# Patient Record
Sex: Male | Born: 1960 | Race: White | Hispanic: No | Marital: Single | Smoking: Never smoker
Health system: Southern US, Community
[De-identification: ages and names within clinical notes are randomized; demographics above are authoritative.]

## PROBLEM LIST (undated history)

## (undated) DIAGNOSIS — I10 Essential (primary) hypertension: Secondary | ICD-10-CM

---

## 1999-06-16 ENCOUNTER — Encounter: Payer: Self-pay | Admitting: Internal Medicine

## 1999-06-16 ENCOUNTER — Ambulatory Visit (HOSPITAL_COMMUNITY): Admission: RE | Admit: 1999-06-16 | Discharge: 1999-06-16 | Payer: Self-pay | Admitting: Internal Medicine

## 2002-10-08 ENCOUNTER — Ambulatory Visit (HOSPITAL_COMMUNITY): Admission: RE | Admit: 2002-10-08 | Discharge: 2002-10-08 | Payer: Self-pay | Admitting: Internal Medicine

## 2004-12-29 ENCOUNTER — Ambulatory Visit: Payer: Self-pay | Admitting: Internal Medicine

## 2005-01-02 ENCOUNTER — Ambulatory Visit: Payer: Self-pay | Admitting: Internal Medicine

## 2005-04-17 ENCOUNTER — Ambulatory Visit: Payer: Self-pay | Admitting: Internal Medicine

## 2005-05-08 ENCOUNTER — Ambulatory Visit: Payer: Self-pay | Admitting: Internal Medicine

## 2005-06-02 ENCOUNTER — Emergency Department (HOSPITAL_COMMUNITY): Admission: EM | Admit: 2005-06-02 | Discharge: 2005-06-02 | Payer: Self-pay | Admitting: Emergency Medicine

## 2005-07-07 ENCOUNTER — Ambulatory Visit: Payer: Self-pay | Admitting: Internal Medicine

## 2005-09-15 ENCOUNTER — Ambulatory Visit: Payer: Self-pay | Admitting: Internal Medicine

## 2005-12-12 ENCOUNTER — Ambulatory Visit: Payer: Self-pay | Admitting: Internal Medicine

## 2006-01-23 ENCOUNTER — Ambulatory Visit: Payer: Self-pay | Admitting: Internal Medicine

## 2006-02-06 ENCOUNTER — Ambulatory Visit: Payer: Self-pay | Admitting: Internal Medicine

## 2006-03-01 ENCOUNTER — Ambulatory Visit: Payer: Self-pay | Admitting: Internal Medicine

## 2007-06-26 DIAGNOSIS — I10 Essential (primary) hypertension: Secondary | ICD-10-CM | POA: Insufficient documentation

## 2007-06-26 DIAGNOSIS — E785 Hyperlipidemia, unspecified: Secondary | ICD-10-CM

## 2009-08-18 ENCOUNTER — Emergency Department (HOSPITAL_COMMUNITY): Admission: EM | Admit: 2009-08-18 | Discharge: 2009-08-18 | Payer: Self-pay | Admitting: Emergency Medicine

## 2012-05-02 DIAGNOSIS — L259 Unspecified contact dermatitis, unspecified cause: Secondary | ICD-10-CM | POA: Diagnosis not present

## 2012-05-02 DIAGNOSIS — R7309 Other abnormal glucose: Secondary | ICD-10-CM | POA: Diagnosis not present

## 2012-05-02 DIAGNOSIS — I10 Essential (primary) hypertension: Secondary | ICD-10-CM | POA: Diagnosis not present

## 2012-05-02 DIAGNOSIS — F411 Generalized anxiety disorder: Secondary | ICD-10-CM | POA: Diagnosis not present

## 2012-05-02 DIAGNOSIS — E782 Mixed hyperlipidemia: Secondary | ICD-10-CM | POA: Diagnosis not present

## 2012-06-20 ENCOUNTER — Ambulatory Visit (INDEPENDENT_AMBULATORY_CARE_PROVIDER_SITE_OTHER): Payer: 59 | Admitting: Family Medicine

## 2012-06-20 VITALS — BP 120/78 | HR 67 | Resp 20 | Ht 72.5 in | Wt 258.0 lb

## 2012-06-20 DIAGNOSIS — J069 Acute upper respiratory infection, unspecified: Secondary | ICD-10-CM | POA: Diagnosis not present

## 2012-06-20 MED ORDER — AMOXICILLIN 500 MG PO CAPS: 500.0000 mg | ORAL_CAPSULE | Freq: Two times a day (BID) | ORAL | Status: AC

## 2012-06-20 MED ORDER — GUAIFENESIN-CODEINE 200-10 MG/5ML PO LIQD: 5.0000 mL | Freq: Three times a day (TID) | ORAL | Status: AC | PRN

## 2012-06-20 NOTE — Patient Instructions (Addendum)
Use the cough syrup at night or when you want to sleep.  Do not drive with this medicine. Take the Amoxicillin twice daily for 7 days.    If you start feeling worse, come back and see [place].  It was good to meet you.

## 2012-06-20 NOTE — Progress Notes (Signed)
Patient ID: James Osborne, male   DOB: 1961/05/22, 51 y.o.   MRN: [id number] James Osborne is a 51 y.o. male who presents to Urgent Care today for URI symptoms:  1.  URI symptoms:  Present for past 10 days.  Describes rhinorrhea, sinus congestion, moderate cough.  Has tried Delsum without relief.  Sick contacts are none.  No fevers or chills. No nausea or vomiting.  Has had some headaches due to congestion and mild ringing in Right ear which has now resolved.      PMH reviewed.  ROS as above otherwise neg.  No chest pain, palpitations, SOB, Fever, Chills, Abd pain, N/V/D.  Medications reviewed. Current Outpatient Prescriptions  Medication Sig Dispense Refill  . amLODipine (NORVASC) 5 MG tablet Take 5 mg by mouth daily.      Marland Kitchen atorvastatin (LIPITOR) 10 MG tablet Take 10 mg by mouth daily.      . benazepril (LOTENSIN) 10 MG tablet Take 10 mg by mouth daily.      . citalopram (CELEXA) 20 MG tablet Take 20 mg by mouth daily.      Marland Kitchen amoxicillin (AMOXIL) 500 MG capsule Take 1 capsule (500 mg total) by mouth 2 (two) times daily. X 7 days  30 capsule  0  . Guaifenesin-Codeine 200-10 MG/5ML LIQD Take 5 mLs by mouth 3 (three) times daily as needed.  1 Bottle  0    Exam:  BP 120/78  Pulse 67  Resp 20  Ht 6' 0.5" (1.842 m)  Wt 258 lb (117.028 kg)  BMI 34.51 kg/m2  SpO2 96% Gen:  Patient sitting on exam table, appears stated age in no acute distress Head: Normocephalic atraumatic Eyes: EOMI, PERRL, sclera and conjunctiva non-erythematous Nose:  Nasal turbinates grossly enlarged bilaterally. Some exudates noted. Tender to palpation of maxillary sinus  Ear:  TM's and canals clear and non-erythematous BL.  No pain on palpation of pinna BL.  Hearing intact grossly BL. Mouth: Mucosa membranes moist. Tonsils +2, nonenlarged, non-erythematous. Neck: No cervical lymphadenopathy noted Heart:  RRR, no murmurs auscultated. Pulm:  Clear to auscultation bilaterally with good air movement.  No wheezes or  rales noted.      Assessment and Plan:  1.  Plan to treat due to length of symptoms and no improvement.   Will prescribe Amoxicillin x 7 days.  Guaf-codeine for cough relief.  Instructed patient to return in 1 week for checkup or sooner if worsening or no improvement.

## 2012-11-28 DIAGNOSIS — Z23 Encounter for immunization: Secondary | ICD-10-CM | POA: Diagnosis not present

## 2013-01-27 ENCOUNTER — Ambulatory Visit (INDEPENDENT_AMBULATORY_CARE_PROVIDER_SITE_OTHER): Payer: 59 | Admitting: Emergency Medicine

## 2013-01-27 VITALS — BP 172/98 | HR 67 | Temp 97.6°F | Resp 16 | Ht 72.75 in | Wt 254.0 lb

## 2013-01-27 DIAGNOSIS — R110 Nausea: Secondary | ICD-10-CM

## 2013-01-27 DIAGNOSIS — A088 Other specified intestinal infections: Secondary | ICD-10-CM

## 2013-01-27 MED ORDER — ONDANSETRON 8 MG PO TBDP: 8.0000 mg | ORAL_TABLET | Freq: Three times a day (TID) | ORAL | Status: AC | PRN

## 2013-01-27 MED ORDER — LOPERAMIDE HCL 2 MG PO TABS: ORAL_TABLET | ORAL | Status: AC

## 2013-01-27 NOTE — Patient Instructions (Signed)
Viral Gastroenteritis Viral gastroenteritis is also known as stomach flu. This condition affects the stomach and intestinal tract. It can cause sudden diarrhea and vomiting. The illness typically lasts 3 to 8 days. Most people develop an immune response that eventually gets rid of the virus. While this natural response develops, the virus can make you quite ill. CAUSES  Many different viruses can cause gastroenteritis, such as rotavirus or noroviruses. You can catch one of these viruses by consuming contaminated food or water. You may also catch a virus by sharing utensils or other personal items with an infected person or by touching a contaminated surface. SYMPTOMS  The most common symptoms are diarrhea and vomiting. These problems can cause a severe loss of body fluids (dehydration) and a body salt (electrolyte) imbalance. Other symptoms may include:  Fever.  Headache.  Fatigue.  Abdominal pain. DIAGNOSIS  Your caregiver can usually diagnose viral gastroenteritis based on your symptoms and a physical exam. A stool sample may also be taken to test for the presence of viruses or other infections. TREATMENT  This illness typically goes away on its own. Treatments are aimed at rehydration. The most serious cases of viral gastroenteritis involve vomiting so severely that you are not able to keep fluids down. In these cases, fluids must be given through an intravenous line (IV). HOME CARE INSTRUCTIONS   Drink enough fluids to keep your urine clear or pale yellow. Drink small amounts of fluids frequently and increase the amounts as tolerated.  Ask your caregiver for specific rehydration instructions.  Avoid:  Foods high in sugar.  Alcohol.  Carbonated drinks.  Tobacco.  Juice.  Caffeine drinks.  Extremely hot or cold fluids.  Fatty, greasy foods.  Too much intake of anything at one time.  Dairy products until 24 to 48 hours after diarrhea stops.  You may consume probiotics.  Probiotics are active cultures of beneficial bacteria. They may lessen the amount and number of diarrheal stools in adults. Probiotics can be found in yogurt with active cultures and in supplements.  Wash your hands well to avoid spreading the virus.  Only take over-the-counter or prescription medicines for pain, discomfort, or fever as directed by your caregiver. Do not give aspirin to children. Antidiarrheal medicines are not recommended.  Ask your caregiver if you should continue to take your regular prescribed and over-the-counter medicines.  Keep all follow-up appointments as directed by your caregiver. SEEK IMMEDIATE MEDICAL CARE IF:   You are unable to keep fluids down.  You do not urinate at least once every 6 to 8 hours.  You develop shortness of breath.  You notice blood in your stool or vomit. This may look like coffee grounds.  You have abdominal pain that increases or is concentrated in one small area (localized).  You have persistent vomiting or diarrhea.  You have a fever.  The patient is a child younger than 3 months, and he or she has a fever.  The patient is a child older than 3 months, and he or she has a fever and persistent symptoms.  The patient is a child older than 3 months, and he or she has a fever and symptoms suddenly get worse.  The patient is a baby, and he or she has no tears when crying. MAKE SURE YOU:   Understand these instructions.  Will watch your condition.  Will get help right away if you are not doing well or get worse. Document Released: 11/13/2005 Document Revised: 02/05/2012 Document Reviewed: 08/30/2011   ExitCare Patient Information 2013 [place], [place]. Clear Liquid Diet The clear liquid dietconsists of foods that are liquid or will become liquid at room temperature.You should be able to see through the liquid and beverages. Examples of foods allowed on a clear liquid diet include fruit juice, broth or bouillon, gelatin, or frozen  ice pops. The purpose of this diet is to provide necessary fluid, electrolytes such as sodium and potassium, and energy to keep the body functioning during times when you are not able to consume a regular diet.A clear liquid diet should not be continued for long periods of time as it is not nutritionally adequate.  REASONS FOR USING A CLEAR LIQUID DIET  In sudden onset (acute) conditions for a patient before or after surgery.  As the first step in oral feeding.  For fluid and electrolyte replacement in diarrheal diseases.  As a diet before certain medical tests are performed. ADEQUACY The clear liquid diet is adequate only in ascorbic acid, according to the Recommended Dietary Allowances of the [employer]. CHOOSING FOODS Breads and Starches  Allowed:  None are allowed.  Avoid: All are avoided. Vegetables  Allowed:  Strained tomato or vegetable juice.  Avoid: Any others. Fruit  Allowed:  Strained fruit juices and fruit drinks. Include 1 serving of citrus or vitamin C-enriched fruit juice daily.  Avoid: Any others. Meat and Meat Substitutes  Allowed:  None are allowed.  Avoid: All are avoided. Milk  Allowed:  None are allowed.  Avoid: All are avoided. Soups and Combination Foods  Allowed:  Clear bouillon, broth, or strained broth-based soups.  Avoid: Any others. Desserts and Sweets  Allowed:  Sugar, honey. High protein gelatin. Flavored gelatin, ices, or frozen ice pops that do not contain milk.  Avoid: Any others. Fats and Oils  Allowed:  None are allowed.  Avoid: All are avoided. Beverages  Allowed: Cereal beverages, coffee (regular or decaffeinated), tea, or soda at the discretion of your caregiver.  Avoid: Any others. Condiments  Allowed:  Iodized salt.  Avoid: Any others, including pepper. Supplements  Allowed:  Liquid nutrition beverages.  Avoid: Any others that contain lactose or fiber. SAMPLE MEAL PLAN Breakfast  4 oz (120  mL) strained orange juice.   to 1 cup (125 to 250 mL) gelatin (plain or fortified).  1 cup (250 mL) beverage (coffee or tea).  Sugar, if desired. Midmorning Snack   cup (125 mL) gelatin (plain or fortified). Lunch  1 cup (250 mL) broth or consomm.  4 oz (120 mL) strained grapefruit juice.   cup (125 mL) gelatin (plain or fortified).  1 cup (250 mL) beverage (coffee or tea).  Sugar, if desired. Midafternoon Snack   cup (125 mL) fruit ice.   cup (125 mL) strained fruit juice. Dinner  1 cup (250 mL) broth or consomm.   cup (125 mL) cranberry juice.   cup (125 mL) flavored gelatin (plain or fortified).  1 cup (250 mL) beverage (coffee or tea).  Sugar, if desired. Evening Snack  4 oz (120 mL) strained apple juice (vitamin C-fortified).   cup (125 mL) flavored gelatin (plain or fortified). Document Released: 11/13/2005 Document Revised: 02/05/2012 Document Reviewed: 02/10/2011 [facility] Patient Information 2013 [place], [place].

## 2013-01-27 NOTE — Progress Notes (Signed)
[facility] [address], [place] [address] [phone]  Date:  01/27/2013   Name:  James Osborne   DOB:  [birth year]   MRN:  [id number]  PCP:  No primary provider on file.    Chief Complaint: Nausea, Abdominal Pain and Emesis   History of Present Illness:  James Osborne is a 52 y.o. very pleasant male patient who presents with the following:  Ill past 48 hours with nausea but no vomiting. Has myalgias and arthralgias and fatigue.  Abdominal cramping.  No diarrhea.  Has fever and chills.  Works in Personnel officer at [employer].  No sick contacts.  Denies other complaint or health concern today. No improvement with over the counter medications or other home remedies.   Patient Active Problem List  Diagnosis  . HYPERLIPIDEMIA  . HYPERTENSION    No past medical history on file.  No past surgical history on file.  History  Substance Use Topics  . Smoking status: Never Smoker   . Smokeless tobacco: Not on file  . Alcohol Use: Not on file    No family history on file.  No Known Allergies  Medication list has been reviewed and updated.  Current Outpatient Prescriptions on File Prior to Visit  Medication Sig Dispense Refill  . amLODipine (NORVASC) 5 MG tablet Take 5 mg by mouth daily.      Marland Kitchen atorvastatin (LIPITOR) 10 MG tablet Take 10 mg by mouth daily.      . benazepril (LOTENSIN) 10 MG tablet Take 10 mg by mouth daily.      . citalopram (CELEXA) 20 MG tablet Take 20 mg by mouth daily.      . Guaifenesin-Codeine 200-10 MG/5ML LIQD Take 5 mLs by mouth 3 (three) times daily as needed.  1 Bottle  0   No current facility-administered medications on file prior to visit.    Review of Systems:  As per HPI, otherwise negative.    Physical Examination: Filed Vitals:   01/27/13 1007  BP: 172/98  Pulse: 67  Temp: 97.6 F (36.4 C)  Resp: 16   Filed Vitals:   01/27/13 1007  Height: 6' 0.75" (1.848 m)  Weight: 254 lb (115.214 kg)   Body mass  index is 33.74 kg/(m^2). Ideal Body Weight: Weight in (lb) to have BMI = 25: 187.8  GEN: WDWN, NAD, Non-toxic, A & O x 3 HEENT: Atraumatic, Normocephalic. Neck supple. No masses, No LAD. Ears and Nose: No external deformity. CV: RRR, No M/G/R. No JVD. No thrill. No extra heart sounds. PULM: CTA B, no wheezes, crackles, rhonchi. No retractions. No resp. distress. No accessory muscle use. ABD: S, NT, ND, +BS. No rebound. No HSM. EXTR: No c/c/e NEURO Normal gait.  PSYCH: Normally interactive. Conversant. Not depressed or anxious appearing.  Calm demeanor.    Assessment and Plan: Gastroenteritis May not return to work until completely resolved. Imodium zofran   Carmelina Dane, MD

## 2013-01-29 ENCOUNTER — Telehealth: Payer: Self-pay

## 2013-01-29 DIAGNOSIS — R109 Unspecified abdominal pain: Secondary | ICD-10-CM | POA: Diagnosis not present

## 2013-01-29 DIAGNOSIS — Z713 Dietary counseling and surveillance: Secondary | ICD-10-CM | POA: Diagnosis not present

## 2013-01-29 DIAGNOSIS — N390 Urinary tract infection, site not specified: Secondary | ICD-10-CM | POA: Diagnosis not present

## 2013-01-29 NOTE — Telephone Encounter (Signed)
I had completed a prior auth for pt's Zofran over the phone and received approval, and faxed approval to pharmacy.   Called pt to advise him that he can get this now for any nausea he is having. Pt stated that he isn't having any nausea now. He states that he is having a lot of abdominal pain. He has taken the immodium and no longer has diarrhea, states he is constipated now. He states that he is having a lot of pain when he urinates and is having to urinate frequently. Pt reports that he is getting ready to see his PCP, and I advised him that he may have a UTI and the abdominal cramping could be bladder spasms. and should let his PCP know about the urinary problems. Pt agreed and will just have his PCP write any add'l Rxs that are needed.

## 2013-01-29 NOTE — Telephone Encounter (Signed)
Patient's mother is concerned about son's severe stomach pain. She is wondering if we can call anything in for him.  Best [phone]

## 2013-03-05 DIAGNOSIS — L408 Other psoriasis: Secondary | ICD-10-CM | POA: Diagnosis not present

## 2013-03-05 DIAGNOSIS — K921 Melena: Secondary | ICD-10-CM | POA: Diagnosis not present

## 2013-03-05 DIAGNOSIS — K649 Unspecified hemorrhoids: Secondary | ICD-10-CM | POA: Diagnosis not present

## 2013-03-05 DIAGNOSIS — J309 Allergic rhinitis, unspecified: Secondary | ICD-10-CM | POA: Diagnosis not present

## 2013-07-09 DIAGNOSIS — Z1211 Encounter for screening for malignant neoplasm of colon: Secondary | ICD-10-CM | POA: Diagnosis not present

## 2013-07-09 DIAGNOSIS — D126 Benign neoplasm of colon, unspecified: Secondary | ICD-10-CM | POA: Diagnosis not present

## 2013-07-09 DIAGNOSIS — K648 Other hemorrhoids: Secondary | ICD-10-CM | POA: Diagnosis not present

## 2013-07-09 DIAGNOSIS — K5730 Diverticulosis of large intestine without perforation or abscess without bleeding: Secondary | ICD-10-CM | POA: Diagnosis not present

## 2013-07-09 DIAGNOSIS — K620 Anal polyp: Secondary | ICD-10-CM | POA: Diagnosis not present

## 2013-07-09 DIAGNOSIS — K621 Rectal polyp: Secondary | ICD-10-CM | POA: Diagnosis not present

## 2013-07-22 DIAGNOSIS — M79609 Pain in unspecified limb: Secondary | ICD-10-CM | POA: Diagnosis not present

## 2013-11-11 DIAGNOSIS — R7309 Other abnormal glucose: Secondary | ICD-10-CM | POA: Diagnosis not present

## 2013-11-11 DIAGNOSIS — I10 Essential (primary) hypertension: Secondary | ICD-10-CM | POA: Diagnosis not present

## 2013-11-11 DIAGNOSIS — E782 Mixed hyperlipidemia: Secondary | ICD-10-CM | POA: Diagnosis not present

## 2014-05-15 DIAGNOSIS — I10 Essential (primary) hypertension: Secondary | ICD-10-CM | POA: Diagnosis not present

## 2014-05-15 DIAGNOSIS — E782 Mixed hyperlipidemia: Secondary | ICD-10-CM | POA: Diagnosis not present

## 2014-05-15 DIAGNOSIS — Z23 Encounter for immunization: Secondary | ICD-10-CM | POA: Diagnosis not present

## 2014-05-15 DIAGNOSIS — R7309 Other abnormal glucose: Secondary | ICD-10-CM | POA: Diagnosis not present

## 2014-05-15 DIAGNOSIS — Z0000 Encounter for general adult medical examination without abnormal findings: Secondary | ICD-10-CM | POA: Diagnosis not present

## 2014-05-15 DIAGNOSIS — F411 Generalized anxiety disorder: Secondary | ICD-10-CM | POA: Diagnosis not present

## 2014-05-15 DIAGNOSIS — J309 Allergic rhinitis, unspecified: Secondary | ICD-10-CM | POA: Diagnosis not present

## 2014-05-22 DIAGNOSIS — L03211 Cellulitis of face: Secondary | ICD-10-CM | POA: Diagnosis not present

## 2014-05-22 DIAGNOSIS — L0201 Cutaneous abscess of face: Secondary | ICD-10-CM | POA: Diagnosis not present

## 2014-11-13 DIAGNOSIS — E782 Mixed hyperlipidemia: Secondary | ICD-10-CM | POA: Diagnosis not present

## 2014-11-13 DIAGNOSIS — I10 Essential (primary) hypertension: Secondary | ICD-10-CM | POA: Diagnosis not present

## 2014-11-13 DIAGNOSIS — J309 Allergic rhinitis, unspecified: Secondary | ICD-10-CM | POA: Diagnosis not present

## 2014-11-13 DIAGNOSIS — Z23 Encounter for immunization: Secondary | ICD-10-CM | POA: Diagnosis not present

## 2014-11-13 DIAGNOSIS — R7309 Other abnormal glucose: Secondary | ICD-10-CM | POA: Diagnosis not present

## 2014-11-13 DIAGNOSIS — L309 Dermatitis, unspecified: Secondary | ICD-10-CM | POA: Diagnosis not present

## 2014-11-13 DIAGNOSIS — F419 Anxiety disorder, unspecified: Secondary | ICD-10-CM | POA: Diagnosis not present

## 2015-06-07 DIAGNOSIS — R7309 Other abnormal glucose: Secondary | ICD-10-CM | POA: Diagnosis not present

## 2015-06-07 DIAGNOSIS — Z1159 Encounter for screening for other viral diseases: Secondary | ICD-10-CM | POA: Diagnosis not present

## 2015-06-07 DIAGNOSIS — F419 Anxiety disorder, unspecified: Secondary | ICD-10-CM | POA: Diagnosis not present

## 2015-06-07 DIAGNOSIS — E782 Mixed hyperlipidemia: Secondary | ICD-10-CM | POA: Diagnosis not present

## 2015-06-07 DIAGNOSIS — Z1211 Encounter for screening for malignant neoplasm of colon: Secondary | ICD-10-CM | POA: Diagnosis not present

## 2015-06-07 DIAGNOSIS — I10 Essential (primary) hypertension: Secondary | ICD-10-CM | POA: Diagnosis not present

## 2015-06-07 DIAGNOSIS — J309 Allergic rhinitis, unspecified: Secondary | ICD-10-CM | POA: Diagnosis not present

## 2015-06-07 DIAGNOSIS — Z0000 Encounter for general adult medical examination without abnormal findings: Secondary | ICD-10-CM | POA: Diagnosis not present

## 2015-06-07 DIAGNOSIS — L309 Dermatitis, unspecified: Secondary | ICD-10-CM | POA: Diagnosis not present

## 2015-06-07 DIAGNOSIS — Z1389 Encounter for screening for other disorder: Secondary | ICD-10-CM | POA: Diagnosis not present

## 2015-06-07 DIAGNOSIS — Z125 Encounter for screening for malignant neoplasm of prostate: Secondary | ICD-10-CM | POA: Diagnosis not present

## 2015-09-08 DIAGNOSIS — Z23 Encounter for immunization: Secondary | ICD-10-CM | POA: Diagnosis not present

## 2015-09-18 DIAGNOSIS — J0191 Acute recurrent sinusitis, unspecified: Secondary | ICD-10-CM | POA: Diagnosis not present

## 2016-01-06 DIAGNOSIS — L309 Dermatitis, unspecified: Secondary | ICD-10-CM | POA: Diagnosis not present

## 2016-01-06 DIAGNOSIS — I10 Essential (primary) hypertension: Secondary | ICD-10-CM | POA: Diagnosis not present

## 2016-01-06 DIAGNOSIS — R7303 Prediabetes: Secondary | ICD-10-CM | POA: Diagnosis not present

## 2016-01-06 DIAGNOSIS — E782 Mixed hyperlipidemia: Secondary | ICD-10-CM | POA: Diagnosis not present

## 2016-01-06 DIAGNOSIS — R7309 Other abnormal glucose: Secondary | ICD-10-CM | POA: Diagnosis not present

## 2016-01-06 DIAGNOSIS — F419 Anxiety disorder, unspecified: Secondary | ICD-10-CM | POA: Diagnosis not present

## 2016-05-15 DIAGNOSIS — M79674 Pain in right toe(s): Secondary | ICD-10-CM | POA: Diagnosis not present

## 2016-05-15 DIAGNOSIS — B353 Tinea pedis: Secondary | ICD-10-CM | POA: Diagnosis not present

## 2016-05-15 DIAGNOSIS — M7989 Other specified soft tissue disorders: Secondary | ICD-10-CM | POA: Diagnosis not present

## 2016-07-17 DIAGNOSIS — E782 Mixed hyperlipidemia: Secondary | ICD-10-CM | POA: Diagnosis not present

## 2016-07-17 DIAGNOSIS — I10 Essential (primary) hypertension: Secondary | ICD-10-CM | POA: Diagnosis not present

## 2016-07-17 DIAGNOSIS — L309 Dermatitis, unspecified: Secondary | ICD-10-CM | POA: Diagnosis not present

## 2016-07-17 DIAGNOSIS — F419 Anxiety disorder, unspecified: Secondary | ICD-10-CM | POA: Diagnosis not present

## 2016-07-17 DIAGNOSIS — R7301 Impaired fasting glucose: Secondary | ICD-10-CM | POA: Diagnosis not present

## 2016-07-26 DIAGNOSIS — Z23 Encounter for immunization: Secondary | ICD-10-CM | POA: Diagnosis not present

## 2017-01-26 DIAGNOSIS — J3089 Other allergic rhinitis: Secondary | ICD-10-CM | POA: Diagnosis not present

## 2017-01-26 DIAGNOSIS — Z0000 Encounter for general adult medical examination without abnormal findings: Secondary | ICD-10-CM | POA: Diagnosis not present

## 2017-01-26 DIAGNOSIS — F419 Anxiety disorder, unspecified: Secondary | ICD-10-CM | POA: Diagnosis not present

## 2017-01-26 DIAGNOSIS — L309 Dermatitis, unspecified: Secondary | ICD-10-CM | POA: Diagnosis not present

## 2017-01-26 DIAGNOSIS — F79 Unspecified intellectual disabilities: Secondary | ICD-10-CM | POA: Diagnosis not present

## 2017-01-26 DIAGNOSIS — R7303 Prediabetes: Secondary | ICD-10-CM | POA: Diagnosis not present

## 2017-01-26 DIAGNOSIS — I10 Essential (primary) hypertension: Secondary | ICD-10-CM | POA: Diagnosis not present

## 2017-01-26 DIAGNOSIS — Z125 Encounter for screening for malignant neoplasm of prostate: Secondary | ICD-10-CM | POA: Diagnosis not present

## 2017-01-26 DIAGNOSIS — Z1389 Encounter for screening for other disorder: Secondary | ICD-10-CM | POA: Diagnosis not present

## 2017-01-26 DIAGNOSIS — J309 Allergic rhinitis, unspecified: Secondary | ICD-10-CM | POA: Diagnosis not present

## 2017-01-26 DIAGNOSIS — E782 Mixed hyperlipidemia: Secondary | ICD-10-CM | POA: Diagnosis not present

## 2017-01-26 DIAGNOSIS — Z1211 Encounter for screening for malignant neoplasm of colon: Secondary | ICD-10-CM | POA: Diagnosis not present

## 2017-03-09 DIAGNOSIS — M79674 Pain in right toe(s): Secondary | ICD-10-CM | POA: Diagnosis not present

## 2017-05-08 DIAGNOSIS — M109 Gout, unspecified: Secondary | ICD-10-CM | POA: Diagnosis not present

## 2017-07-13 DIAGNOSIS — Z23 Encounter for immunization: Secondary | ICD-10-CM | POA: Diagnosis not present

## 2017-07-17 DIAGNOSIS — M109 Gout, unspecified: Secondary | ICD-10-CM | POA: Diagnosis not present

## 2017-07-17 DIAGNOSIS — L309 Dermatitis, unspecified: Secondary | ICD-10-CM | POA: Diagnosis not present

## 2017-08-09 DIAGNOSIS — L309 Dermatitis, unspecified: Secondary | ICD-10-CM | POA: Diagnosis not present

## 2017-08-09 DIAGNOSIS — F79 Unspecified intellectual disabilities: Secondary | ICD-10-CM | POA: Diagnosis not present

## 2017-08-09 DIAGNOSIS — E782 Mixed hyperlipidemia: Secondary | ICD-10-CM | POA: Diagnosis not present

## 2017-08-09 DIAGNOSIS — R7303 Prediabetes: Secondary | ICD-10-CM | POA: Diagnosis not present

## 2017-08-09 DIAGNOSIS — M109 Gout, unspecified: Secondary | ICD-10-CM | POA: Diagnosis not present

## 2017-08-09 DIAGNOSIS — F419 Anxiety disorder, unspecified: Secondary | ICD-10-CM | POA: Diagnosis not present

## 2017-08-09 DIAGNOSIS — I10 Essential (primary) hypertension: Secondary | ICD-10-CM | POA: Diagnosis not present

## 2017-08-09 DIAGNOSIS — Z6838 Body mass index (BMI) 38.0-38.9, adult: Secondary | ICD-10-CM | POA: Diagnosis not present

## 2017-12-26 DIAGNOSIS — J069 Acute upper respiratory infection, unspecified: Secondary | ICD-10-CM | POA: Diagnosis not present

## 2018-01-08 ENCOUNTER — Emergency Department (HOSPITAL_COMMUNITY): Payer: Medicare Other

## 2018-01-08 ENCOUNTER — Emergency Department (HOSPITAL_COMMUNITY)
Admission: EM | Admit: 2018-01-08 | Discharge: 2018-01-08 | Disposition: A | Discharge status: Home / Self Care | Payer: Medicare Other | Attending: Emergency Medicine | Admitting: Emergency Medicine

## 2018-01-08 ENCOUNTER — Encounter (HOSPITAL_COMMUNITY): Payer: Self-pay

## 2018-01-08 ENCOUNTER — Other Ambulatory Visit: Payer: Self-pay

## 2018-01-08 DIAGNOSIS — R1110 Vomiting, unspecified: Secondary | ICD-10-CM | POA: Insufficient documentation

## 2018-01-08 DIAGNOSIS — Z79899 Other long term (current) drug therapy: Secondary | ICD-10-CM | POA: Insufficient documentation

## 2018-01-08 DIAGNOSIS — F79 Unspecified intellectual disabilities: Secondary | ICD-10-CM | POA: Diagnosis not present

## 2018-01-08 DIAGNOSIS — R55 Syncope and collapse: Secondary | ICD-10-CM

## 2018-01-08 DIAGNOSIS — R61 Generalized hyperhidrosis: Secondary | ICD-10-CM | POA: Insufficient documentation

## 2018-01-08 DIAGNOSIS — I959 Hypotension, unspecified: Secondary | ICD-10-CM | POA: Diagnosis not present

## 2018-01-08 DIAGNOSIS — E860 Dehydration: Secondary | ICD-10-CM | POA: Insufficient documentation

## 2018-01-08 HISTORY — DX: Essential (primary) hypertension: I10

## 2018-01-08 LAB — URINALYSIS, ROUTINE W REFLEX MICROSCOPIC
Bilirubin Urine: NEGATIVE
GLUCOSE, UA: NEGATIVE mg/dL
Hgb urine dipstick: NEGATIVE
Ketones, ur: NEGATIVE mg/dL
LEUKOCYTES UA: NEGATIVE
Nitrite: NEGATIVE
PROTEIN: NEGATIVE mg/dL
Specific Gravity, Urine: 1.045 — ABNORMAL HIGH (ref 1.005–1.030)
pH: 5 (ref 5.0–8.0)

## 2018-01-08 LAB — CBC WITH DIFFERENTIAL/PLATELET
BASOS ABS: 0 10*3/uL (ref 0.0–0.1)
BASOS PCT: 0 %
EOS ABS: 0.1 10*3/uL (ref 0.0–0.7)
EOS PCT: 1 %
HCT: 43.5 % (ref 39.0–52.0)
Hemoglobin: 15.2 g/dL (ref 13.0–17.0)
Lymphocytes Relative: 36 %
Lymphs Abs: 3.2 10*3/uL (ref 0.7–4.0)
MCH: 29.7 pg (ref 26.0–34.0)
MCHC: 34.9 g/dL (ref 30.0–36.0)
MCV: 85.1 fL (ref 78.0–100.0)
MONO ABS: 0.7 10*3/uL (ref 0.1–1.0)
Monocytes Relative: 8 %
Neutro Abs: 4.8 10*3/uL (ref 1.7–7.7)
Neutrophils Relative %: 55 %
PLATELETS: 420 10*3/uL — AB (ref 150–400)
RBC: 5.11 MIL/uL (ref 4.22–5.81)
RDW: 12.3 % (ref 11.5–15.5)
WBC: 8.8 10*3/uL (ref 4.0–10.5)

## 2018-01-08 LAB — COMPREHENSIVE METABOLIC PANEL
ALBUMIN: 4 g/dL (ref 3.5–5.0)
ALT: 43 U/L (ref 17–63)
AST: 42 U/L — AB (ref 15–41)
Alkaline Phosphatase: 122 U/L (ref 38–126)
Anion gap: 16 — ABNORMAL HIGH (ref 5–15)
BUN: 15 mg/dL (ref 6–20)
CHLORIDE: 101 mmol/L (ref 101–111)
CO2: 20 mmol/L — ABNORMAL LOW (ref 22–32)
Calcium: 10 mg/dL (ref 8.9–10.3)
Creatinine, Ser: 1.44 mg/dL — ABNORMAL HIGH (ref 0.61–1.24)
GFR calc Af Amer: >60 mL/min (ref >60)
GFR, EST NON AFRICAN AMERICAN: 53 mL/min — AB (ref >60)
GLUCOSE: 174 mg/dL — AB (ref 65–99)
POTASSIUM: 4.2 mmol/L (ref 3.5–5.1)
Sodium: 137 mmol/L (ref 135–145)
Total Bilirubin: 1.3 mg/dL — ABNORMAL HIGH (ref 0.3–1.2)
Total Protein: 8.5 g/dL — ABNORMAL HIGH (ref 6.5–8.1)

## 2018-01-08 LAB — TROPONIN I: Troponin I: <0.03 ng/mL (ref <0.03)

## 2018-01-08 LAB — CBG MONITORING, ED
GLUCOSE-CAPILLARY: 179 mg/dL — AB (ref 65–99)
Glucose-Capillary: 142 mg/dL — ABNORMAL HIGH (ref 65–99)
Glucose-Capillary: 129 mg/dL — ABNORMAL HIGH (ref 65–99)

## 2018-01-08 LAB — I-STAT CG4 LACTIC ACID, ED
LACTIC ACID, VENOUS: 0.92 mmol/L (ref 0.5–1.9)
Lactic Acid, Venous: 3.98 mmol/L (ref 0.5–1.9)

## 2018-01-08 MED ORDER — SODIUM CHLORIDE 0.9 % IV BOLUS (SEPSIS)
1000.0000 mL | Freq: Once | INTRAVENOUS | Status: AC
  Administered 2018-01-08: 1000 mL via INTRAVENOUS

## 2018-01-08 MED ORDER — IOPAMIDOL (ISOVUE-370) INJECTION 76%
INTRAVENOUS | Status: AC
  Administered 2018-01-08: 100 mL via INTRAVENOUS
  Filled 2018-01-08: qty 100

## 2018-01-08 MED ORDER — SODIUM CHLORIDE 0.9 % IV SOLN
INTRAVENOUS | Status: DC
  Administered 2018-01-08: 16:00:00 via INTRAVENOUS

## 2018-01-08 MED ORDER — LACTATED RINGERS IV BOLUS (SEPSIS): 1000.0000 mL | Freq: Once | INTRAVENOUS | Status: DC

## 2018-01-08 MED ORDER — FLUTICASONE PROPIONATE 50 MCG/ACT NA SUSP: 2.0000 | Freq: Every day | NASAL | 0 refills | Status: AC

## 2018-01-08 MED ORDER — ONDANSETRON HCL 4 MG/2ML IJ SOLN
4.0000 mg | Freq: Once | INTRAMUSCULAR | Status: AC
  Administered 2018-01-08: 4 mg via INTRAVENOUS
  Filled 2018-01-08: qty 2

## 2018-01-08 NOTE — Discharge Instructions (Signed)
I would temporarily hold your BENAZEPRIL (LOTENSIN) medication for the next 3-5 days. Call your primary doctor to discuss your recent ER visit and possible future medication changes.  Drink at least 6-8 glasses of water daily.  For your sinuses, use FLONASE daily for the next several weeks. It is important to use this EVERY DAY.  You can also go purchase an over-the-counter SINUS RINSE, like a Netipot. Make sure you use bottled water with this to prevent infection.

## 2018-01-08 NOTE — ED Triage Notes (Addendum)
Patient was at work and had a syncopal episode according to co workers that lasted approx 3 minutes. Coworkers reported that the patient ws drooling, mouth"fumbling" and sweating profusely. Patient vomited x 1 in his mother's car on the way to the ED. Patient's mother also reports that the patient has finished the Z-pack for a sinus infection.

## 2018-01-08 NOTE — ED Provider Notes (Signed)
[facility] DEPT Provider Note   CSN: [id number] Arrival date & time: 01/08/18  1304     History   Chief Complaint Chief Complaint  Patient presents with  . Loss of Consciousness    HPI James Osborne is a 57 y.o. male.  Pt presents to the ED today with syncopal episode.  Pt has a hx of mild mr and lives with his mom.  Pt has been sick with a sinus infection, but felt good today so he went to work.  While at work, he had a syncopal episode lasting approximately 3 minutes.  Pt was drooling and sweating.  They called his mom who brought him here.  The pt vomited in his mom's car.  He said he does not feel good.  He denies any pain.      Past Medical History:  Diagnosis Date  . Hypertension     Patient Active Problem List   Diagnosis Date Noted  . HYPERLIPIDEMIA 06/26/2007  . HYPERTENSION 06/26/2007    History reviewed. No pertinent surgical history.     Home Medications    Prior to Admission medications   Medication Sig Start Date End Date Taking? Authorizing Provider  allopurinol (ZYLOPRIM) 100 MG tablet Take 100 mg by mouth daily.  10/16/17  Yes [provider]  amLODipine (NORVASC) 5 MG tablet Take 5 mg by mouth daily.   Yes [provider]  atorvastatin (LIPITOR) 40 MG tablet Take 40 mg by mouth daily. 10/30/17  Yes [provider]  benazepril (LOTENSIN) 20 MG tablet Take 20 mg by mouth daily. 10/30/17  Yes [provider]  benzonatate (TESSALON) 100 MG capsule Take 100 mg by mouth 3 (three) times daily as needed for cough. 12/26/17  Yes [provider]  citalopram (CELEXA) 40 MG tablet Take 40 mg by mouth daily. 12/05/17  Yes [provider]  Multiple Vitamin (MULTIVITAMIN WITH MINERALS) TABS tablet Take 1 tablet by mouth daily.   Yes [provider]  Guaifenesin-Codeine 200-10 MG/5ML LIQD Take 5 mLs by mouth 3 (three) times daily as needed. Patient not taking: Reported on  01/08/2018 06/20/12   Alveda Reasons, MD  loperamide (IMODIUM A-D) 2 MG tablet 2 now and one hourly prn diarrhea.  Max 8 in 24 hours Patient not taking: Reported on 01/08/2018 01/27/13   Roselee Culver, MD  ondansetron (ZOFRAN-ODT) 8 MG disintegrating tablet Take 1 tablet (8 mg total) by mouth every 8 (eight) hours as needed for nausea. Patient not taking: Reported on 01/08/2018 01/27/13   Roselee Culver, MD    Family History Family History  Problem Relation Age of Onset  . Diabetes Mother   . Hypertension Mother     Social History Social History   Tobacco Use  . Smoking status: Never Smoker  . Smokeless tobacco: Never Used  Substance Use Topics  . Alcohol use: No    Frequency: Never  . Drug use: No     Allergies   Bee venom   Review of Systems Review of Systems  Neurological: Positive for syncope and weakness.  All other systems reviewed and are negative.    Physical Exam Updated Vital Signs BP 114/71   Pulse 66   Resp 13   Ht 6\' 1"  (1.854 m)   Wt 102.1 kg (225 lb)   SpO2 99%   BMI 29.69 kg/m   Physical Exam  Constitutional: He is oriented to person, place, and time. He appears well-developed. He appears  distressed.  HENT:  Head: Normocephalic and atraumatic.  Right Ear: External ear normal.  Left Ear: External ear normal.  Nose: Nose normal.  Mouth/Throat: Oropharynx is clear and moist.  Eyes: Conjunctivae and EOM are normal. Pupils are equal, round, and reactive to light.  Neck: Normal range of motion. Neck supple.  Cardiovascular: Normal rate, regular rhythm, normal heart sounds and intact distal pulses.  Pulmonary/Chest: Effort normal and breath sounds normal.  Abdominal: Soft. Bowel sounds are normal.  Musculoskeletal: Normal range of motion.  Neurological: He is alert and oriented to person, place, and time.  Skin: Skin is warm. Capillary refill takes less than 2 seconds. He is diaphoretic.  Psychiatric: He has a normal mood and affect. His  behavior is normal. Judgment and thought content normal.  Nursing note and vitals reviewed.    ED Treatments / Results  Labs (all labs ordered are listed, but only abnormal results are displayed) Labs Reviewed  CBC WITH DIFFERENTIAL/PLATELET - Abnormal; Notable for the following components:      Result Value   Platelets 420 (*)    All other components within normal limits  COMPREHENSIVE METABOLIC PANEL - Abnormal; Notable for the following components:   CO2 20 (*)    Glucose, Bld 174 (*)    Creatinine, Ser 1.44 (*)    Total Protein 8.5 (*)    AST 42 (*)    Total Bilirubin 1.3 (*)    GFR calc non Af Amer 53 (*)    Anion gap 16 (*)    All other components within normal limits  CBG MONITORING, ED - Abnormal; Notable for the following components:   Glucose-Capillary 142 (*)    All other components within normal limits  CBG MONITORING, ED - Abnormal; Notable for the following components:   Glucose-Capillary 179 (*)    All other components within normal limits  I-STAT CG4 LACTIC ACID, ED - Abnormal; Notable for the following components:   Lactic Acid, Venous 3.98 (*)    All other components within normal limits  CULTURE, BLOOD (ROUTINE X 2)  CULTURE, BLOOD (ROUTINE X 2)  TROPONIN I  URINALYSIS, ROUTINE W REFLEX MICROSCOPIC  I-STAT CG4 LACTIC ACID, ED    EKG  EKG Interpretation  Date/Time:  Tuesday January 08 2018 13:13:41 EST Ventricular Rate:  77 PR Interval:    QRS Duration: 97 QT Interval:  424 QTC Calculation: 480 R Axis:   -7 Text Interpretation:  Sinus rhythm Borderline prolonged QT interval Baseline wander in lead(s) II III aVL aVF V3 V4 V5 V6 Confirmed by Isla Pence [phone]) on 01/08/2018 1:31:01 PM       Radiology Ct Head Wo Contrast  Result Date: 01/08/2018 CLINICAL DATA:  Altered level of consciousness. EXAM: CT HEAD WITHOUT CONTRAST TECHNIQUE: Contiguous axial images were obtained from the base of the skull through the vertex without intravenous  contrast. COMPARISON:  None. FINDINGS: Brain: Ventricle size normal. Cerebral volume normal for age. Mild hypodensity in the periventricular white matter, with a chronic appearance. Negative for acute infarct. Negative for hemorrhage mass or edema. No midline shift. Vascular: Negative for acute vascular thrombosis. Skull: Negative Sinuses/Orbits: Mild mucosal edema in the paranasal sinuses. Normal orbit. Other: None IMPRESSION: No acute abnormality. Mild periventricular white matter changes most likely due to chronic microvascular ischemia. Electronically Signed   By: Franchot Gallo M.D.   On: 01/08/2018 13:59   Dg Chest Port 1 View  Result Date: 01/08/2018 CLINICAL DATA:  Syncope EXAM: PORTABLE CHEST 1 VIEW COMPARISON:  None. FINDINGS: The heart size and mediastinal contours are within normal limits. Both lungs are clear. The visualized skeletal structures are unremarkable. IMPRESSION: No active disease. Electronically Signed   By: Franchot Gallo M.D.   On: 01/08/2018 14:27    Procedures Procedures (including critical care time)  Medications Ordered in ED Medications  sodium chloride 0.9 % bolus 1,000 mL (0 mLs Intravenous Stopped 01/08/18 1441)    And  sodium chloride 0.9 % bolus 1,000 mL (1,000 mLs Intravenous New Bag/Given 01/08/18 1444)    And  0.9 %  sodium chloride infusion (not administered)  sodium chloride 0.9 % bolus 1,000 mL (1,000 mLs Intravenous New Bag/Given 01/08/18 1330)  ondansetron (ZOFRAN) injection 4 mg (4 mg Intravenous Given 01/08/18 1437)  iopamidol (ISOVUE-370) 76 % injection (100 mLs Intravenous Contrast Given 01/08/18 1542)     Initial Impression / Assessment and Plan / ED Course  I have reviewed the triage vital signs and the nursing notes.  Pertinent labs & imaging results that were available during my care of the patient were reviewed by me and considered in my medical decision making (see chart for details).  Pt is feeling much better.  He looks much better.  BP  is much improved.  Pt will be signed out to Dr. Ellender Hose who will follow urine, 2nd lactic and CT scans.  CRITICAL CARE Performed by: Isla Pence   Total critical care time:  30 minutes  Critical care time was exclusive of separately billable procedures and treating other patients.  Critical care was necessary to treat or prevent imminent or life-threatening deterioration.  Critical care was time spent personally by me on the following activities: development of treatment plan with patient and/or surrogate as well as nursing, discussions with consultants, evaluation of patient's response to treatment, examination of patient, obtaining history from patient or surrogate, ordering and performing treatments and interventions, ordering and review of laboratory studies, ordering and review of radiographic studies, pulse oximetry and re-evaluation of patient's condition.    Final Clinical Impressions(s) / ED Diagnoses   Final diagnoses:  Dehydration  Syncope, unspecified syncope type  Hypotension, unspecified hypotension type    ED Discharge Orders    None       Isla Pence, MD 01/08/18 [phone]

## 2018-01-08 NOTE — ED Notes (Signed)
Bed: TG90 Expected date:  Expected time:  Means of arrival:  Comments: Hold for OD

## 2018-01-08 NOTE — ED Provider Notes (Addendum)
Assumed care from Dr. Gilford Raid at 4 PM.  Briefly, the patient is a 57 year old male sent here after syncopal episode at work.  Patient markedly dehydrated clinically and has been suffering with a sinus infection.  He is been given IV fluids with significant relief.  CT scan is pending at this time.  Suspect that most of this is due to dehydration with poor p.o. intake in the setting of sinus infection.  Plan to follow-up CT, repeat lactic acid, and reassess.  CT scan shows no acute abnormality.  Urinalysis is consistent with a significant dehydration.  Lactic acid is completely clear with fluids.  He is amatory in the ED without difficulty.  He notes that he has not been eating and drinking very much due to his sinusitis.  He has no signs of invasive sinusitis.  Will advised him to start Flonase, sinus rinses with sterile water, and encourage p.o. fluids at home.  Given absence of any signs of anemia, arrhythmia, or high risk syncope features, feels reasonable to discharge him with close outpatient follow-up.  QT less than 500 and I do not suspect arrhythmia as etiology for his syncope and he is no longer on any QT prolonging medications.  I am also going to have him hold his benazepril in the setting of possible mild prerenal AK I.  Encourage fluids.  He will call his PCP regarding recent visit.    Duffy Bruce, MD 01/08/18 1751    Duffy Bruce, MD 01/08/18 1755

## 2018-01-13 LAB — CULTURE, BLOOD (ROUTINE X 2)
Culture: NO GROWTH
Culture: NO GROWTH
SPECIAL REQUESTS: ADEQUATE
SPECIAL REQUESTS: ADEQUATE

## 2018-08-29 IMAGING — CT CT HEAD W/O CM
3 series · 16 of 47 positions shown, 19 images · non-contrast
Comparison: None.

CLINICAL DATA: Altered level of consciousness.

EXAM:
CT HEAD WITHOUT CONTRAST
TECHNIQUE: Contiguous axial images were obtained from the base of the skull
through the vertex without intravenous contrast.

[Series 2: head wo · axial · 0.47mm/px · z∈[-141,-16]mm · 10 of 31 slices shown, 13 images]
[im 3/31  brain]
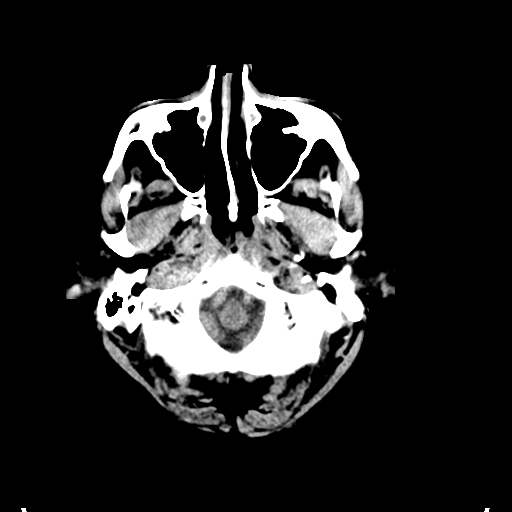
[im 3/31  bone]
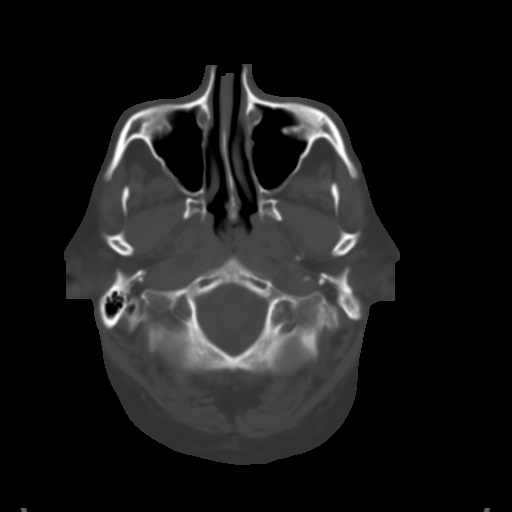
[im 6/31  brain]
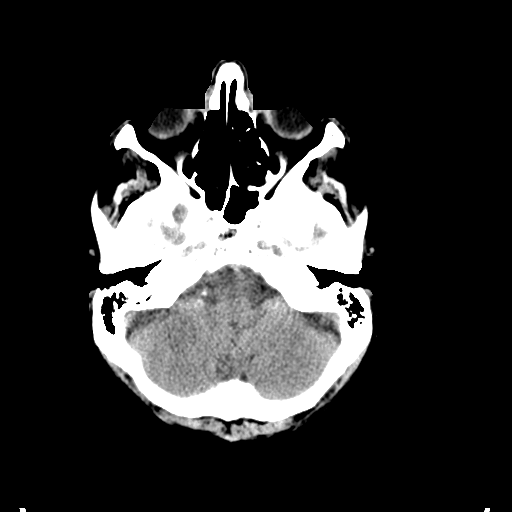
[im 9/31  brain]
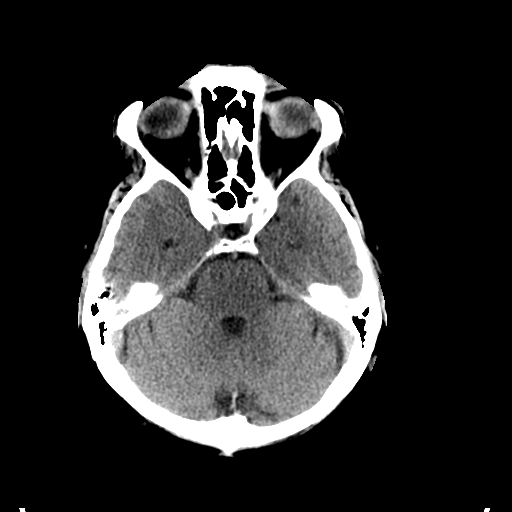
[im 11/31  brain]
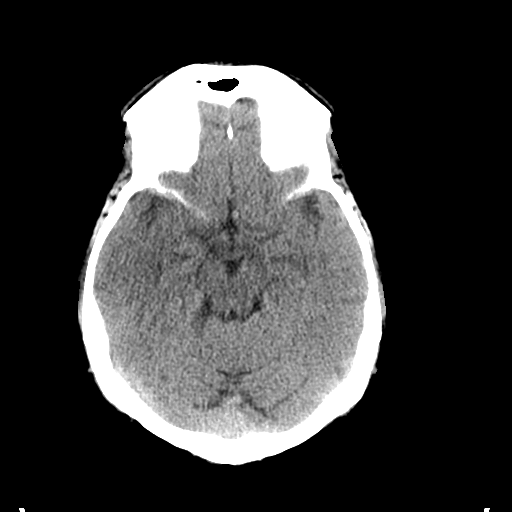
[im 14/31  brain]
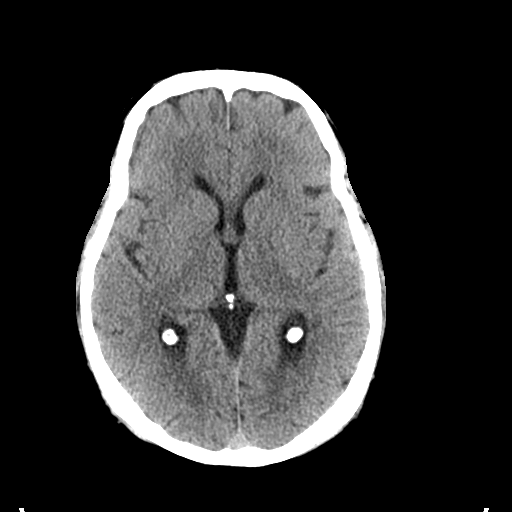
[im 14/31  bone]
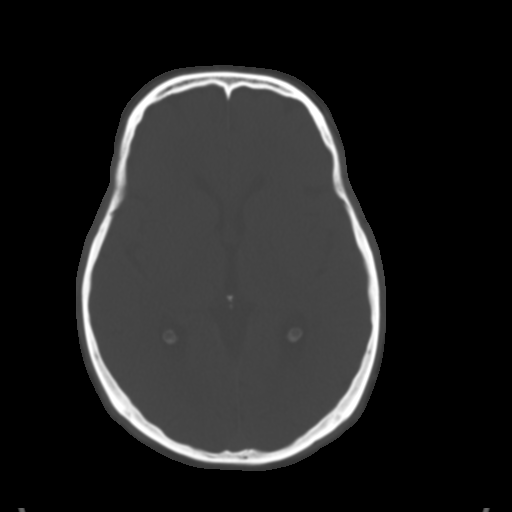
[im 17/31  brain]
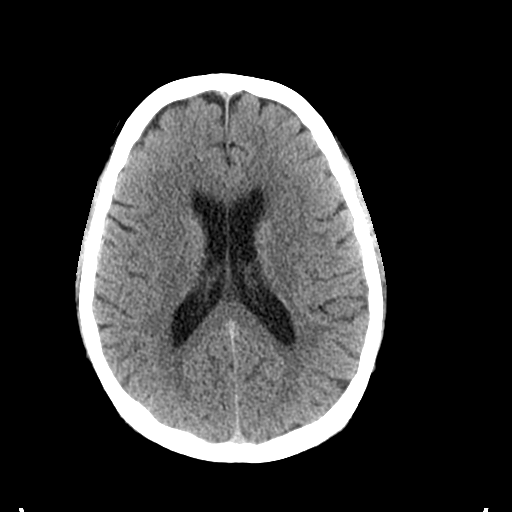
[im 20/31  brain]
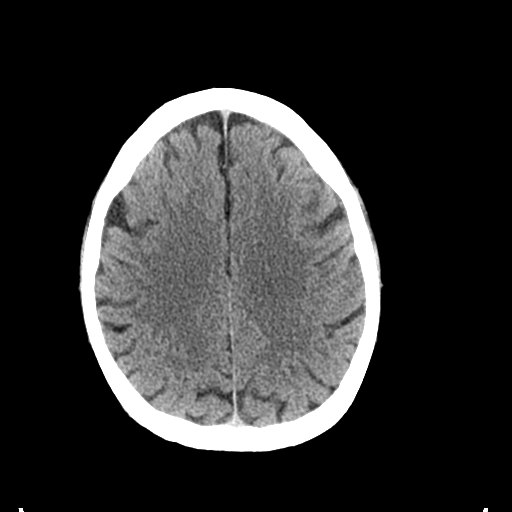
[im 23/31  brain]
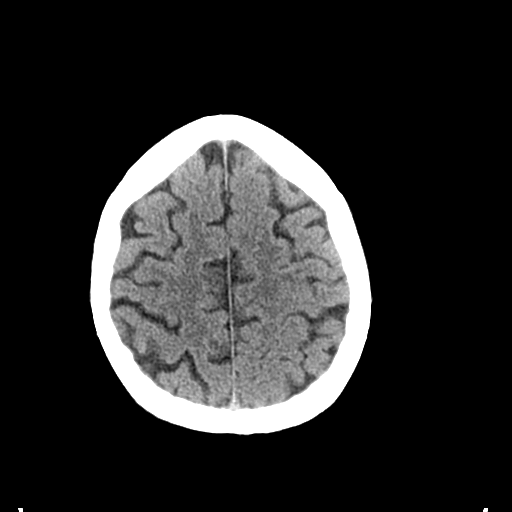
[im 25/31  brain]
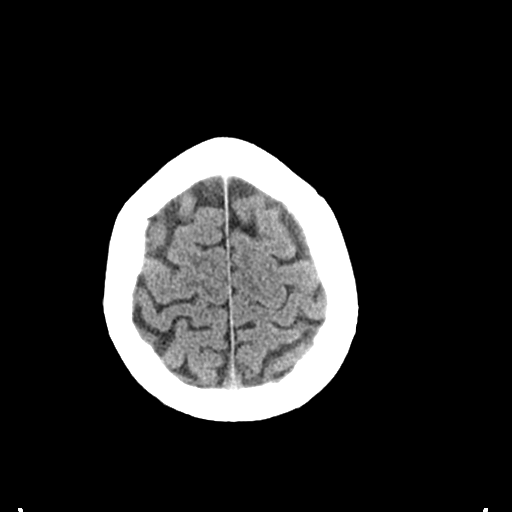
[im 25/31  bone]
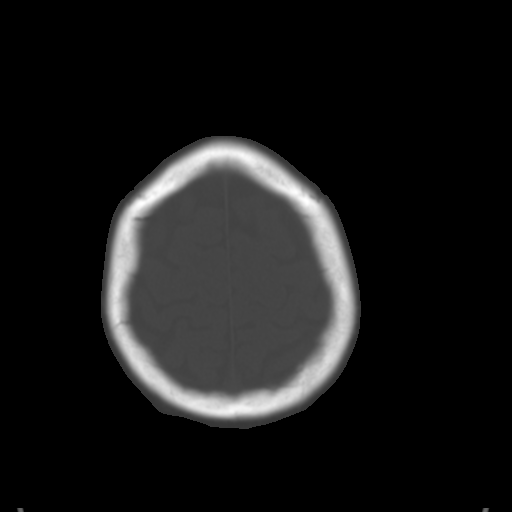
[im 28/31  brain]
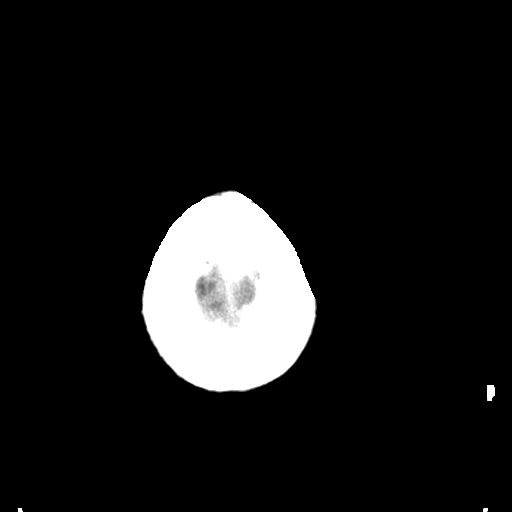

[Series 5: coronal soft tissue · coronal · 0.29mm/px · 3 of 65 slices shown]
[im 22/65  brain]
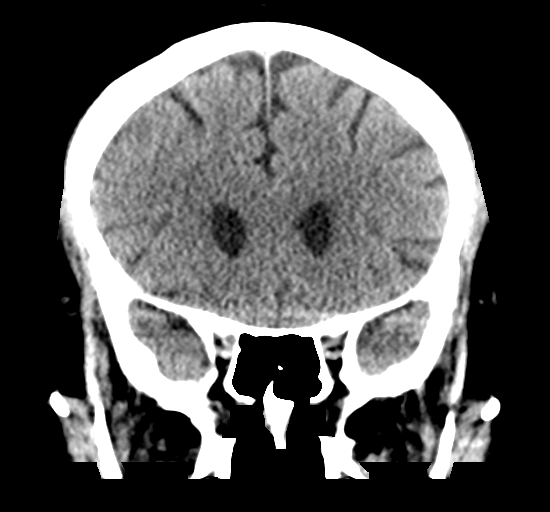
[im 29/65  brain]
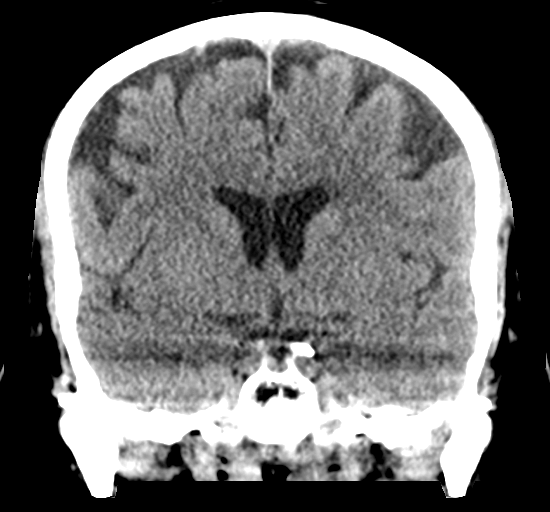
[im 36/65  brain]
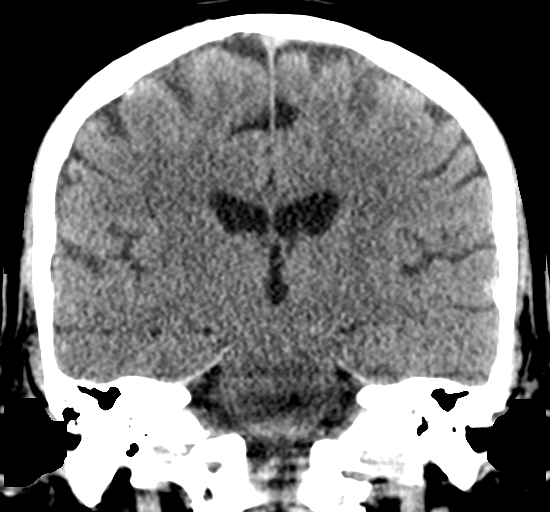

[Series 6: sagittal soft tissue · sagittal · 0.30mm/px · 3 of 53 slices shown]
[im 18/53  brain]
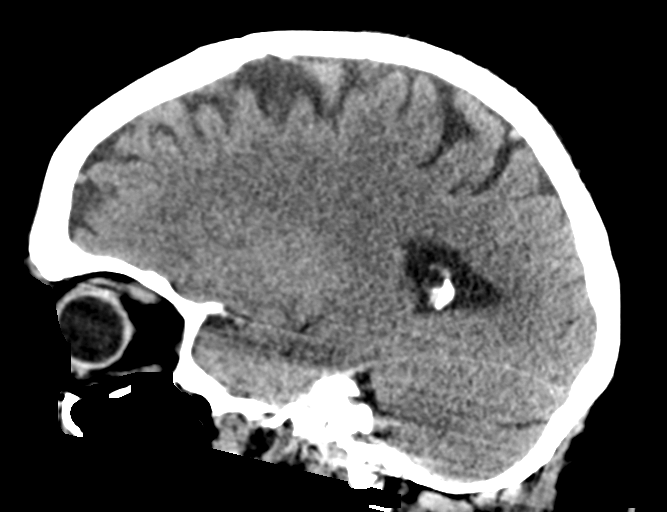
[im 27/53  brain]
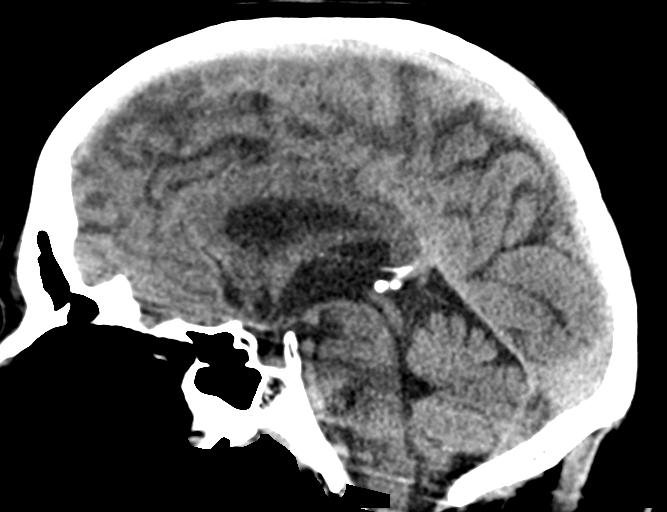
[im 35/53  brain]
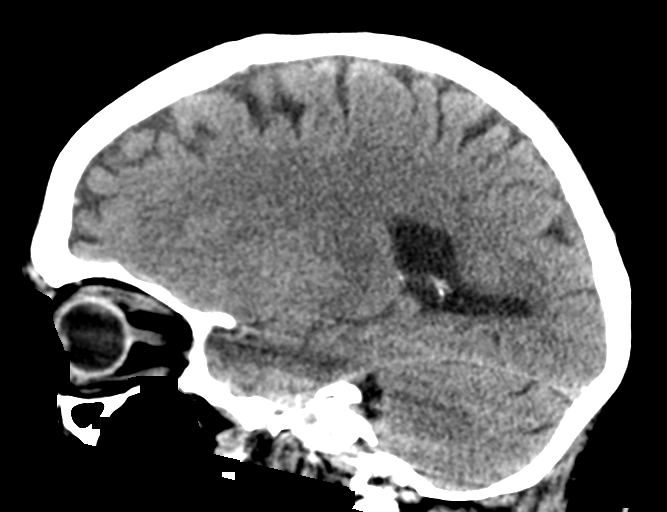

[16 of 47 positions shown; findings below may reference images not displayed]

FINDINGS: Brain: Ventricle size normal. Cerebral volume normal for age. Mild
hypodensity in the periventricular white matter, with a chronic
appearance. Negative for acute infarct. Negative for hemorrhage mass
or edema. No midline shift.

Vascular: Negative for acute vascular thrombosis.

Skull: Negative

Sinuses/Orbits: Mild mucosal edema in the paranasal sinuses. Normal
orbit.

Other: None
IMPRESSION: No acute abnormality. Mild periventricular white matter changes most
likely due to chronic microvascular ischemia.

## 2018-08-29 IMAGING — CT CT ANGIO CHEST-ABD-PELV FOR DISSECTION W/ AND WO/W CM
2 of 7 series · 11 of 36 positions shown, 17 images · IV contrast (iopamidol)
Comparison: None.

CLINICAL DATA: Hypotension and syncope.

EXAM:
CT ANGIOGRAPHY CHEST, ABDOMEN AND PELVIS
TECHNIQUE: Multidetector CT imaging through the chest, abdomen and pelvis was
performed using the standard protocol during bolus administration of
intravenous contrast. Multiplanar reconstructed images and MIPs were
obtained and reviewed to evaluate the vascular anatomy.
CONTRAST:  <100 cc> 2GBSU8-L7K IOPAMIDOL (2GBSU8-L7K) INJECTION 76%

[Series 5: axial arterial · axial · arterial · 0.93mm/px · z∈[+1148,+1738]mm · 10 of 233 slices shown, 15 images]
[im 18/233  mediastinal]
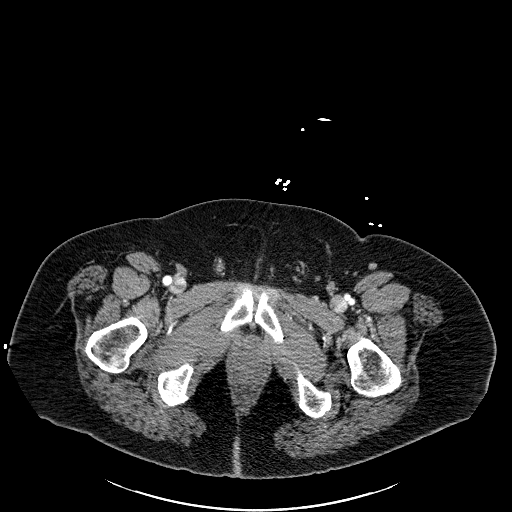
[im 18/233  bone]
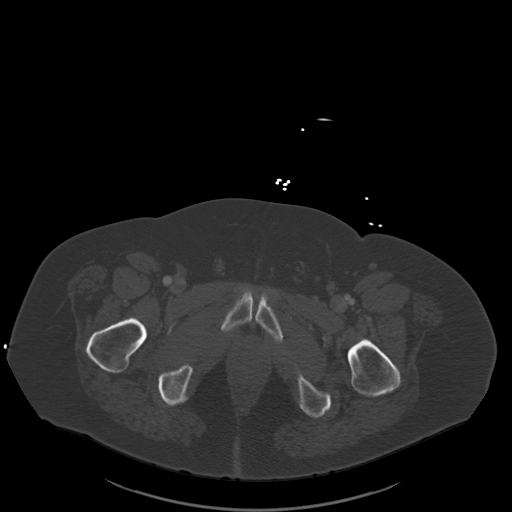
[im 54/233  mediastinal]
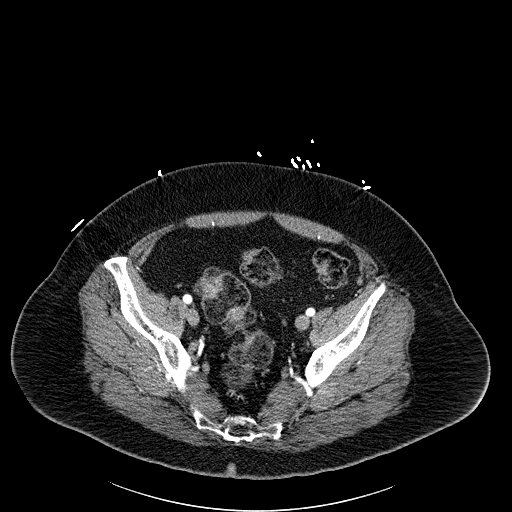
[im 72/233  mediastinal]
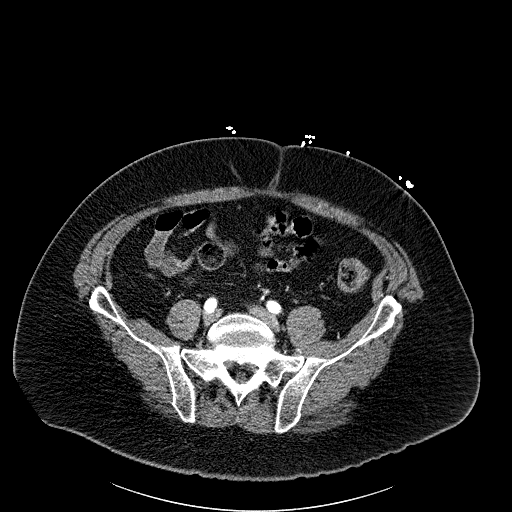
[im 90/233  mediastinal]
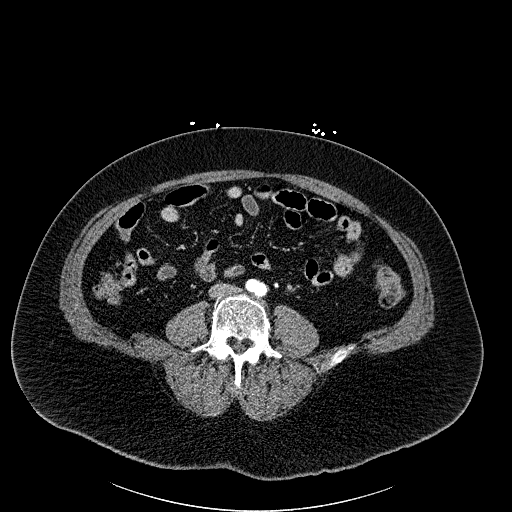
[im 125/233  mediastinal]
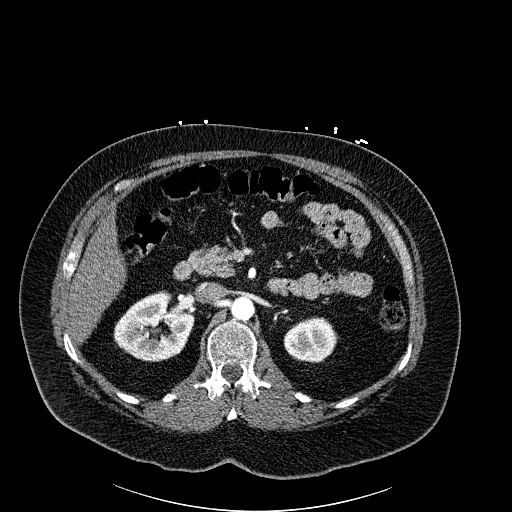
[im 143/233  mediastinal]
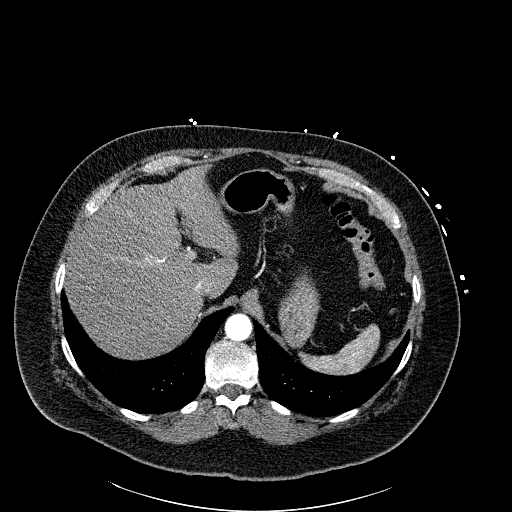
[im 161/233  mediastinal]
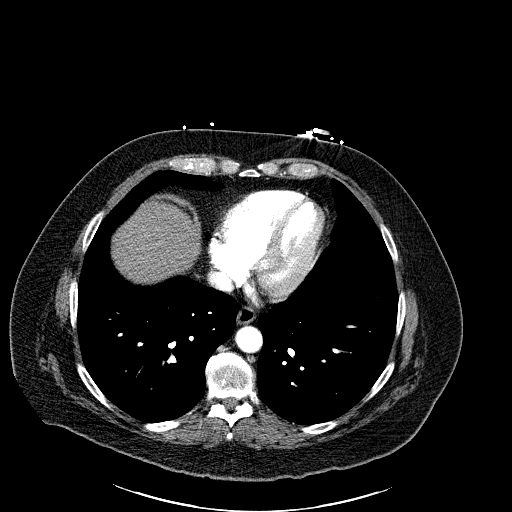
[im 161/233  lung]
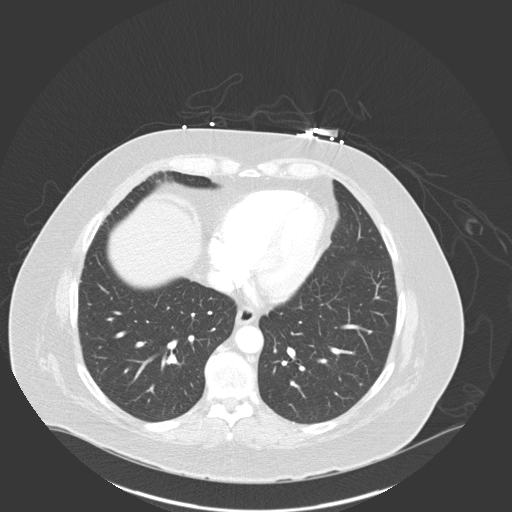
[im 179/233  lung]
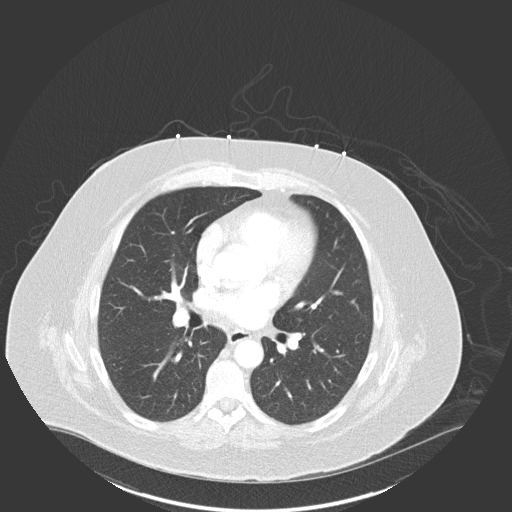
[im 197/233  mediastinal]
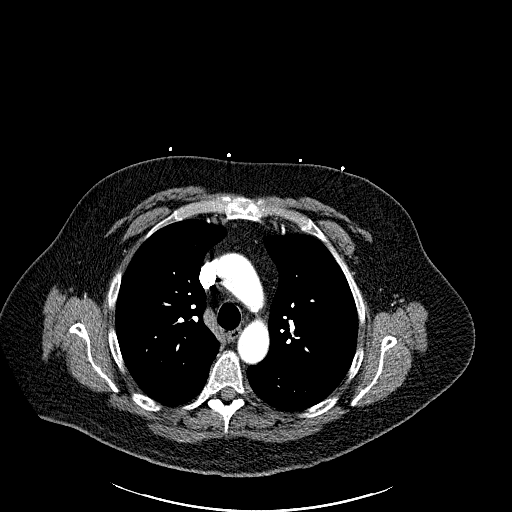
[im 197/233  lung]
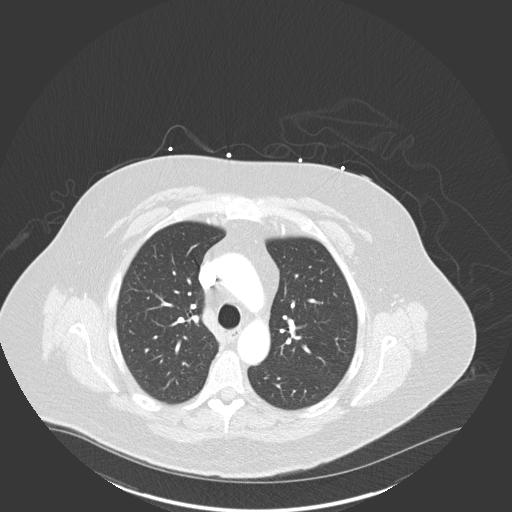
[im 215/233  mediastinal]
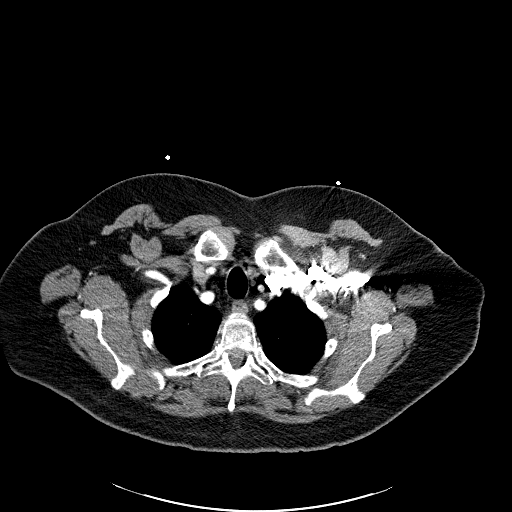
[im 215/233  lung]
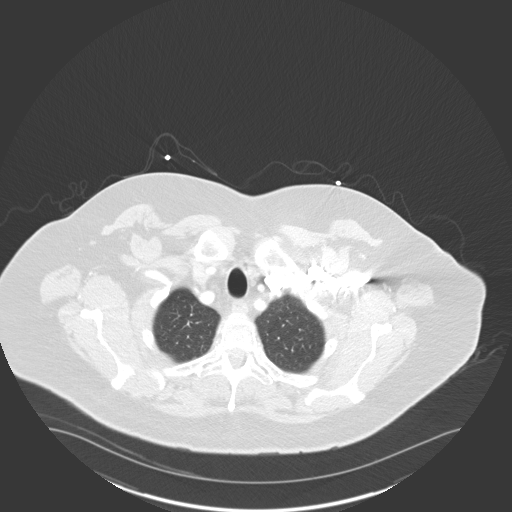
[im 215/233  bone]
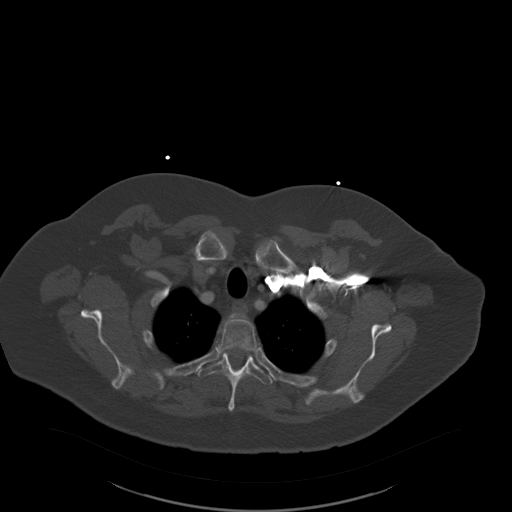

[Series 7: coronals · coronal · 0.98mm/px · 1 of 214 slices shown, 2 images]
[im 107/214  mediastinal]
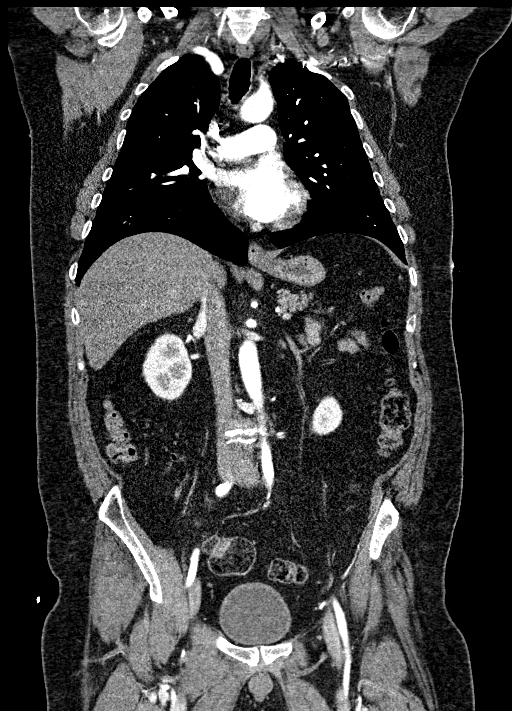
[im 107/214  bone]
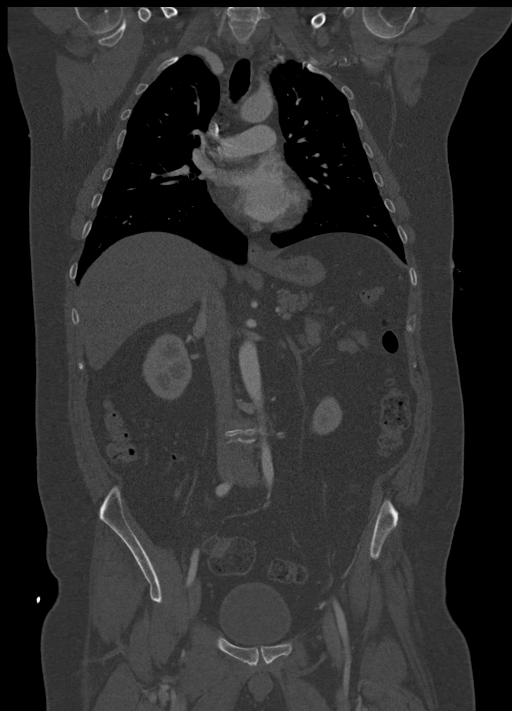

[11 of 36 positions shown; findings below may reference images not displayed]

FINDINGS: CTA CHEST FINDINGS

Cardiovascular: Preferential opacification of the thoracic aorta. No
evidence of thoracic aortic aneurysm or dissection. Normal heart
size. No pericardial effusion. No central pulmonary embolism.

Mediastinum/Nodes: No enlarged mediastinal, hilar, or axillary lymph
nodes. Thyroid gland, trachea, and esophagus demonstrate no
significant findings.

Lungs/Pleura: The lungs are clear. No focal consolidation, pleural
effusion, or pneumothorax. 3 mm pulmonary nodule in the right lower
lobe.

Musculoskeletal: No chest wall abnormality. No acute or significant
osseous findings.

Review of the MIP images confirms the above findings.

CTA ABDOMEN AND PELVIS FINDINGS

VASCULAR

Aorta: Normal caliber aorta without aneurysm, dissection, vasculitis
or significant stenosis.

Celiac: Patent without evidence of aneurysm, dissection, vasculitis
or significant stenosis.

SMA: Patent without evidence of aneurysm, dissection, vasculitis or
significant stenosis.

Renals: Single right and two left renal arteries are patent without
evidence of aneurysm, dissection, vasculitis, fibromuscular
dysplasia or significant stenosis.

IMA: Patent without evidence of aneurysm, dissection, vasculitis or
significant stenosis.

Inflow: Patent without evidence of aneurysm, dissection, vasculitis
or significant stenosis.

Veins: No obvious venous abnormality within the limitations of this
arterial phase study.

Review of the MIP images confirms the above findings.

NON-VASCULAR

Hepatobiliary: Hepatic steatosis. No focal liver abnormality. The
gallbladder is unremarkable. No biliary dilatation.

Pancreas: Unremarkable. No pancreatic ductal dilatation or
surrounding inflammatory changes.

Spleen: Normal in size without focal abnormality.

Adrenals/Urinary Tract: The adrenal glands are unremarkable.
Punctate nonobstructive bilateral renal calculi. No hydronephrosis.
Bladder is unremarkable.

Stomach/Bowel: Stomach is within normal limits. Appendix appears
normal. No evidence of bowel wall thickening, distention, or
inflammatory changes.

Lymphatic: No enlarged abdominal or pelvic lymph nodes.

Reproductive: Prostate is unremarkable.

Other: No abdominal wall hernia or abnormality. No abdominopelvic
ascites.

Musculoskeletal: No acute or significant osseous findings.

Review of the MIP images confirms the above findings.
IMPRESSION: Vascular:

1. No evidence of aortic aneurysm, dissection, or penetrating ulcer.

Chest:

1.  No acute intrathoracic process.

Abdomen and pelvis:

1.  No acute intra-abdominal process.
2. Hepatic steatosis.
3. Bilateral punctate nonobstructive nephrolithiasis.

## 2020-02-17 ENCOUNTER — Ambulatory Visit: Payer: Medicare Other

## 2020-02-17 ENCOUNTER — Ambulatory Visit: Payer: Self-pay

## 2020-03-02 ENCOUNTER — Ambulatory Visit: Payer: Medicare Other

## 2020-03-02 ENCOUNTER — Ambulatory Visit: Payer: Medicare Other | Attending: Family

## 2020-03-02 DIAGNOSIS — Z23 Encounter for immunization: Secondary | ICD-10-CM

## 2020-03-02 NOTE — Progress Notes (Signed)
   Cov[facility] Vaccination [facility]  Name:  James Osborne    MRN: [id number] DOB: 1961-09-07  03/02/2020  [name] was observed post Covid-19 immunization for 15 minutes without incident. He was provided with Vaccine Information Sheet and instruction to access the V-Safe system.   [name] was instructed to call 911 with any severe reactions post vaccine: Marland Kitchen Difficulty breathing  . Swelling of face and throat  . A fast heartbeat  . A bad rash all over body  . Dizziness and weakness   Immunizations Administered    Name Date Dose VIS Date Route   Moderna COVID-19 Vaccine 03/02/2020  9:59 AM 0.5 mL 10/28/2019 Intramuscular   Manufacturer: Moderna   Lot: [id number]   NectarBE:[id number]

## 2024-11-26 NOTE — Progress Notes (Signed)
 James Osborne                                          MRN: [id number]   11/26/2024   The [facility] Quality Team Specialist reviewed this patient medical record for the purposes of chart review for care gap closure. The following were reviewed: chart review for care gap closure-kidney health evaluation for diabetes:eGFR  and uACR.    [facility] Quality Team
